# Patient Record
Sex: Male | Born: 1985 | Race: White | Hispanic: No | Marital: Married
Health system: Southern US, Community
[De-identification: ages and names within clinical notes are randomized; demographics above are authoritative.]

## PROBLEM LIST (undated history)

## (undated) DIAGNOSIS — F419 Anxiety disorder, unspecified: Secondary | ICD-10-CM

---

## 2015-03-25 ENCOUNTER — Encounter (HOSPITAL_COMMUNITY): Payer: Self-pay | Admitting: Emergency Medicine

## 2015-03-25 ENCOUNTER — Emergency Department (HOSPITAL_COMMUNITY)
Admission: EM | Admit: 2015-03-25 | Discharge: 2015-03-25 | Disposition: A | Discharge status: Home / Self Care | Payer: Medicaid Other | Attending: Emergency Medicine | Admitting: Emergency Medicine

## 2015-03-25 ENCOUNTER — Emergency Department (HOSPITAL_COMMUNITY): Payer: Medicaid Other

## 2015-03-25 DIAGNOSIS — N200 Calculus of kidney: Secondary | ICD-10-CM | POA: Diagnosis not present

## 2015-03-25 DIAGNOSIS — Z8659 Personal history of other mental and behavioral disorders: Secondary | ICD-10-CM | POA: Diagnosis not present

## 2015-03-25 DIAGNOSIS — R1031 Right lower quadrant pain: Secondary | ICD-10-CM | POA: Diagnosis present

## 2015-03-25 DIAGNOSIS — R109 Unspecified abdominal pain: Secondary | ICD-10-CM

## 2015-03-25 HISTORY — DX: Anxiety disorder, unspecified: F41.9

## 2015-03-25 LAB — COMPREHENSIVE METABOLIC PANEL
ALBUMIN: 4.5 g/dL (ref 3.5–5.0)
ALK PHOS: 84 U/L (ref 38–126)
ALT: 16 U/L — AB (ref 17–63)
ANION GAP: 7 (ref 5–15)
AST: 24 U/L (ref 15–41)
BUN: 5 mg/dL — ABNORMAL LOW (ref 6–20)
CO2: 27 mmol/L (ref 22–32)
Calcium: 9.4 mg/dL (ref 8.9–10.3)
Chloride: 105 mmol/L (ref 101–111)
Creatinine, Ser: 1.19 mg/dL (ref 0.61–1.24)
GFR calc Af Amer: >60 mL/min (ref >60)
GFR calc non Af Amer: >60 mL/min (ref >60)
Glucose, Bld: 102 mg/dL — ABNORMAL HIGH (ref 65–99)
POTASSIUM: 4 mmol/L (ref 3.5–5.1)
SODIUM: 139 mmol/L (ref 135–145)
TOTAL PROTEIN: 7.3 g/dL (ref 6.5–8.1)
Total Bilirubin: 1 mg/dL (ref 0.3–1.2)

## 2015-03-25 LAB — CBC
HCT: 44.1 % (ref 39.0–52.0)
HEMOGLOBIN: 15.2 g/dL (ref 13.0–17.0)
MCH: 32.8 pg (ref 26.0–34.0)
MCHC: 34.5 g/dL (ref 30.0–36.0)
MCV: 95 fL (ref 78.0–100.0)
PLATELETS: 283 10*3/uL (ref 150–400)
RBC: 4.64 MIL/uL (ref 4.22–5.81)
RDW: 12.8 % (ref 11.5–15.5)
WBC: 16.9 10*3/uL — ABNORMAL HIGH (ref 4.0–10.5)

## 2015-03-25 LAB — URINE MICROSCOPIC-ADD ON

## 2015-03-25 LAB — URINALYSIS, ROUTINE W REFLEX MICROSCOPIC
Glucose, UA: NEGATIVE mg/dL
Ketones, ur: NEGATIVE mg/dL
NITRITE: NEGATIVE
Protein, ur: 100 mg/dL — AB
SPECIFIC GRAVITY, URINE: 1.018 (ref 1.005–1.030)
UROBILINOGEN UA: 0.2 mg/dL (ref 0.0–1.0)
pH: 6.5 (ref 5.0–8.0)

## 2015-03-25 LAB — LIPASE, BLOOD: LIPASE: 20 U/L — AB (ref 22–51)

## 2015-03-25 MED ORDER — KETOROLAC TROMETHAMINE 30 MG/ML IJ SOLN
30.0000 mg | Freq: Once | INTRAMUSCULAR | Status: AC
  Administered 2015-03-25: 30 mg via INTRAVENOUS
  Filled 2015-03-25: qty 1

## 2015-03-25 MED ORDER — IBUPROFEN 800 MG PO TABS: 800.0000 mg | ORAL_TABLET | Freq: Three times a day (TID) | ORAL | Status: AC

## 2015-03-25 NOTE — Discharge Instructions (Signed)

## 2015-03-25 NOTE — ED Provider Notes (Signed)
CSN: [id number]     Arrival date & time 03/25/15  1521 History   First MD Initiated Contact with Patient 03/25/15 1548     Chief Complaint  Patient presents with  . Abdominal Pain     (Consider location/radiation/quality/duration/timing/severity/associated sxs/prior Treatment) Patient is a 29 y.o. male presenting with abdominal pain. The history is provided by the patient.  Abdominal Pain Pain location:  RLQ Pain quality: sharp   Pain radiates to:  Back Pain severity:  Moderate Onset quality:  Gradual Timing:  Constant Progression:  Unchanged Chronicity:  New Context: not sick contacts   Associated symptoms: no chills, no cough, no fever and no shortness of breath     Past Medical History  Diagnosis Date  . Anxiety    History reviewed. No pertinent past surgical history. No family history on file. Social History  Substance Use Topics  . Smoking status: None  . Smokeless tobacco: None  . Alcohol Use: None    Review of Systems  Constitutional: Negative for fever and chills.  Respiratory: Negative for cough and shortness of breath.   Gastrointestinal: Positive for abdominal pain.  All other systems reviewed and are negative.     Allergies  Demerol and Morphine and related  Home Medications   Prior to Admission medications   Not on File   BP 118/70 mmHg  Pulse 71  Temp(Src) 97.5 F (36.4 C) (Oral)  Resp 17  SpO2 98% Physical Exam  Constitutional: He is oriented to person, place, and time. He appears well-developed and well-nourished. No distress.  HENT:  Head: Normocephalic and atraumatic.  Mouth/Throat: No oropharyngeal exudate.  Eyes: EOM are normal. Pupils are equal, round, and reactive to light.  Neck: Normal range of motion. Neck supple.  Cardiovascular: Normal rate and regular rhythm.  Exam reveals no friction rub.   No murmur heard. Pulmonary/Chest: Effort normal and breath sounds normal. No respiratory distress. He has no wheezes. He has no  rales.  Abdominal: Soft. He exhibits no distension. There is no tenderness. There is no rebound.  Musculoskeletal: Normal range of motion. He exhibits no edema.  Neurological: He is alert and oriented to person, place, and time.  Skin: He is not diaphoretic.  Nursing note and vitals reviewed.   ED Course  Procedures (including critical care time) Labs Review Labs Reviewed  LIPASE, BLOOD - Abnormal; Notable for the following:    Lipase 20 (*)    All other components within normal limits  COMPREHENSIVE METABOLIC PANEL - Abnormal; Notable for the following:    Glucose, Bld 102 (*)    BUN 5 (*)    ALT 16 (*)    All other components within normal limits  CBC - Abnormal; Notable for the following:    WBC 16.9 (*)    All other components within normal limits  URINALYSIS, ROUTINE W REFLEX MICROSCOPIC (NOT AT [facility]) - Abnormal; Notable for the following:    Color, Urine RED (*)    APPearance TURBID (*)    Hgb urine dipstick LARGE (*)    Bilirubin Urine SMALL (*)    Protein, ur 100 (*)    Leukocytes, UA SMALL (*)    All other components within normal limits  URINE MICROSCOPIC-ADD ON    Imaging Review No results found. Billie Ruddy, personally reviewed and evaluated these images and lab results as part of my medical decision-making.   EKG Interpretation None      MDM   Final diagnoses:  Right flank  pain  Kidney stone    29 year old male here with right lower quadrant pain. Associated hematuria. Radiated to the right flank. No pain at this time. Exam is benign. Denies any genitourinary symptoms. Concern for kidney stone. Toradol given. Will obtain a stone study.  Study negative for acute stone. Likely recently passed stone. Given motrin.   Elwin Mocha, MD 03/25/15 2026

## 2015-03-25 NOTE — ED Notes (Signed)
Per EMS pt complaint of right lower abdominal pain onset an hour ago; associated symptoms n/v; denies GU symptoms. Pt given 250 mcg of fentanyl and 4 mg of zofran en route with EMS.

## 2016-10-05 IMAGING — CT CT RENAL STONE PROTOCOL
2 of 3 series · 16 of 42 positions shown, 18 images · non-contrast
Comparison: None

CLINICAL DATA: Right flank pain starting today

EXAM:
CT ABDOMEN AND PELVIS WITHOUT CONTRAST
TECHNIQUE: Multidetector CT imaging of the abdomen and pelvis was performed
following the standard protocol without IV contrast.

[Series 3: coronal · coronal · 0.74mm/px · 3 of 110 slices shown]
[im 37/110  soft-tissue]
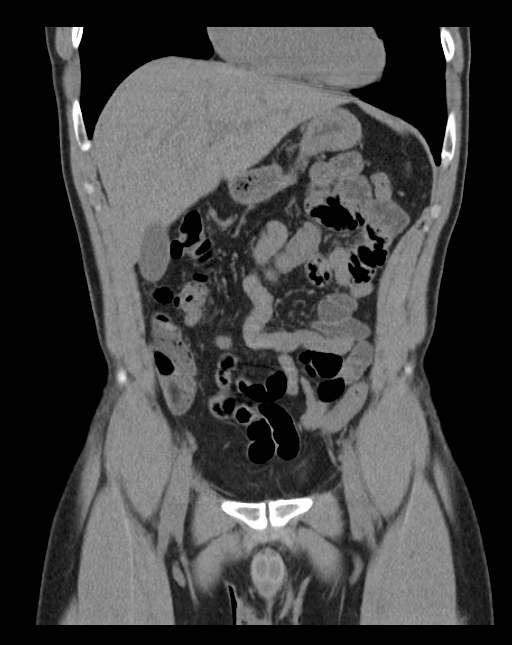
[im 49/110  soft-tissue]
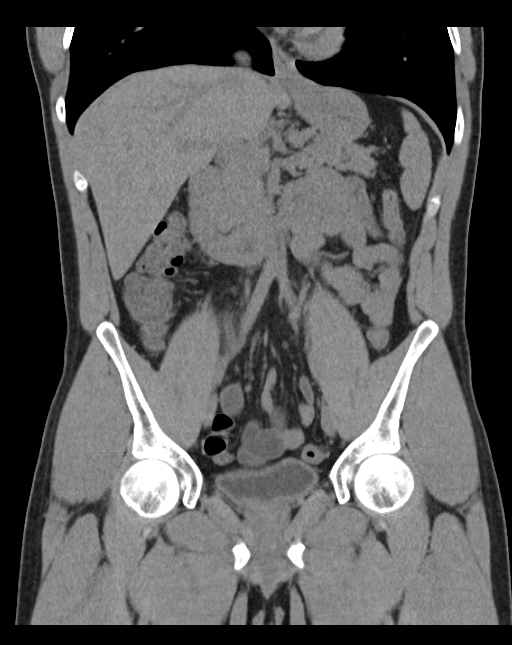
[im 61/110  soft-tissue]
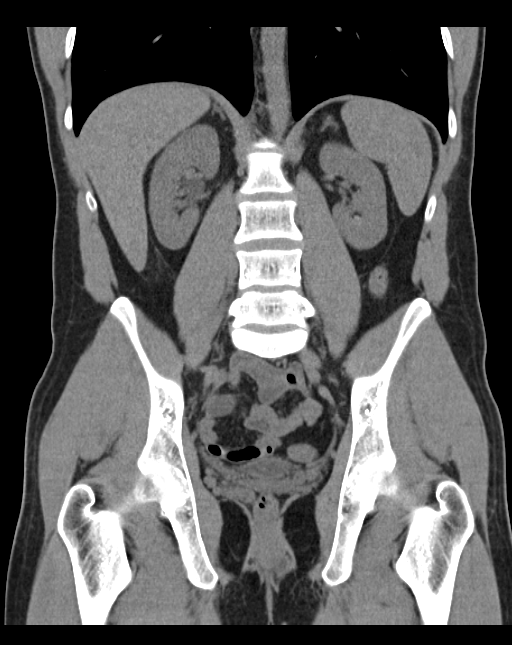

[Series 6: lung · axial · 0.76mm/px · z∈[+1266,+1386]mm · 13 of 28 slices shown, 15 images]
[im 3/28  soft-tissue]
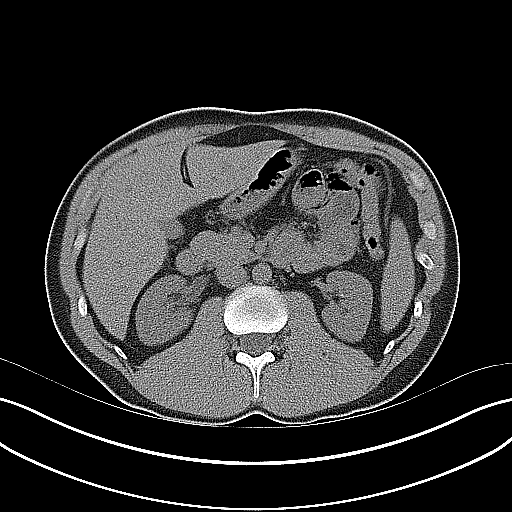
[im 3/28  bone]
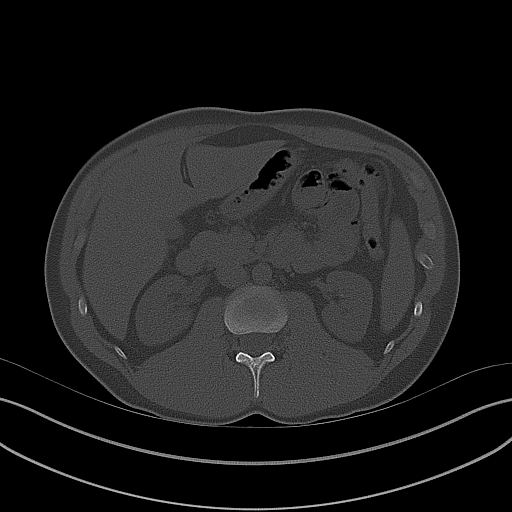
[im 5/28  soft-tissue]
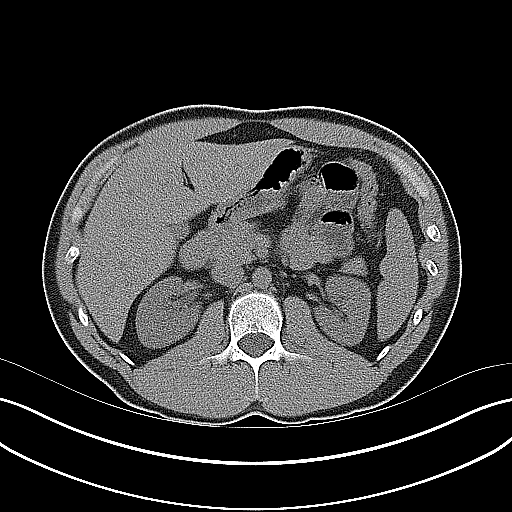
[im 7/28  soft-tissue]
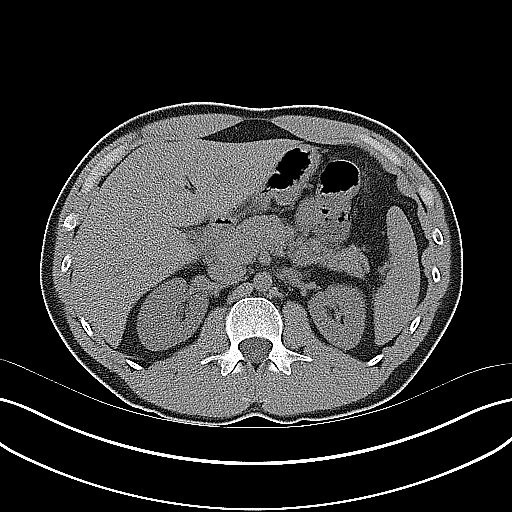
[im 9/28  soft-tissue]
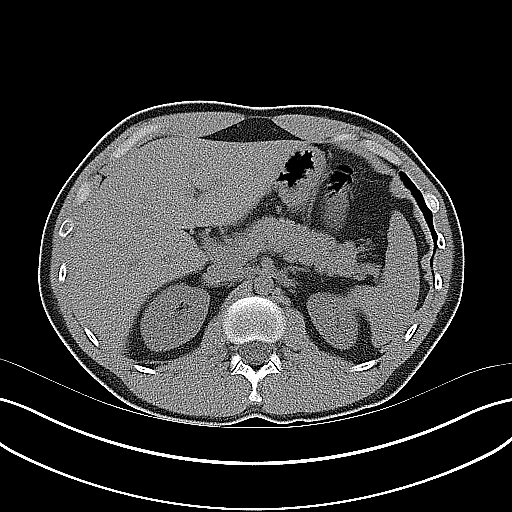
[im 11/28  soft-tissue]
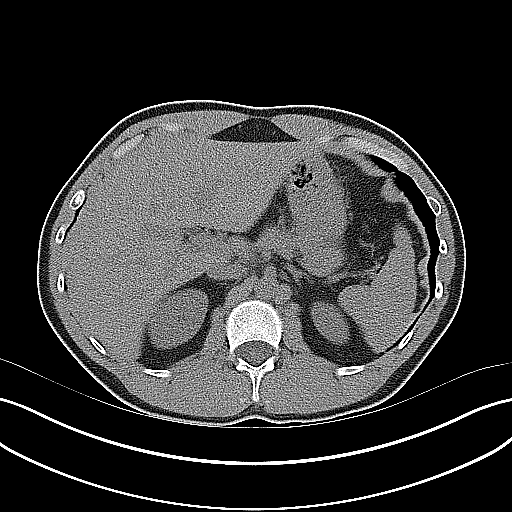
[im 13/28  soft-tissue]
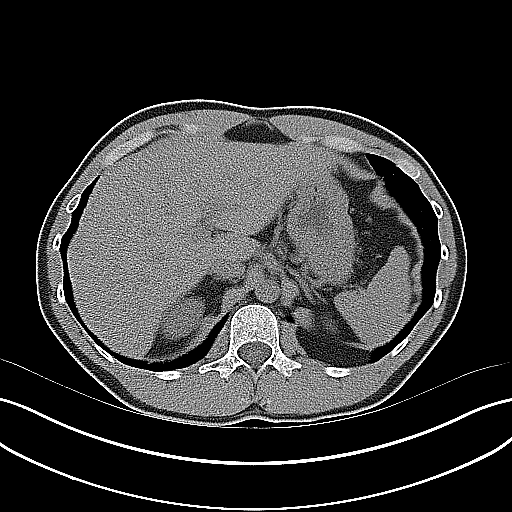
[im 15/28  soft-tissue]
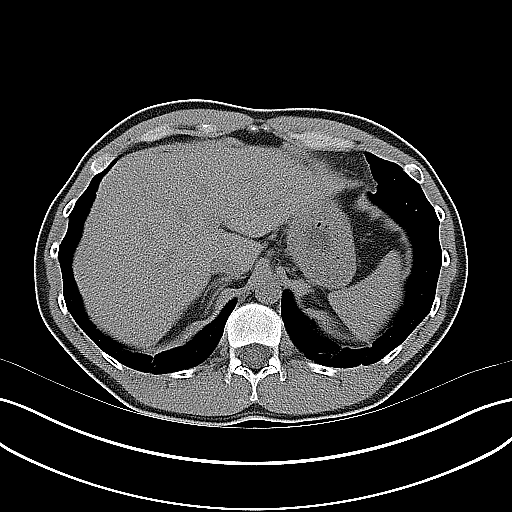
[im 17/28  soft-tissue]
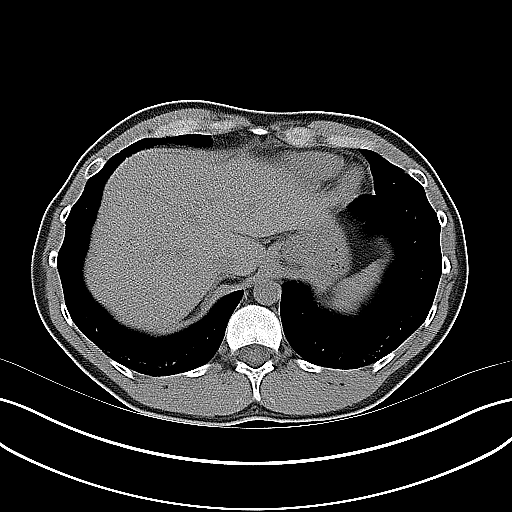
[im 19/28  soft-tissue]
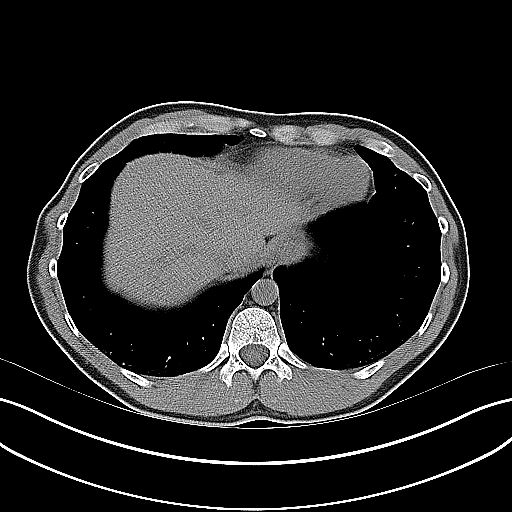
[im 19/28  bone]
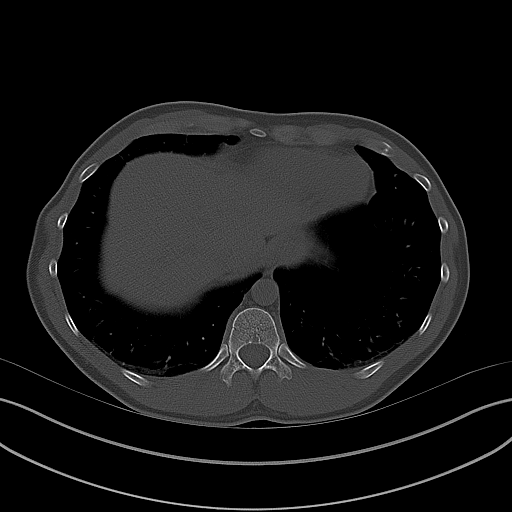
[im 21/28  soft-tissue]
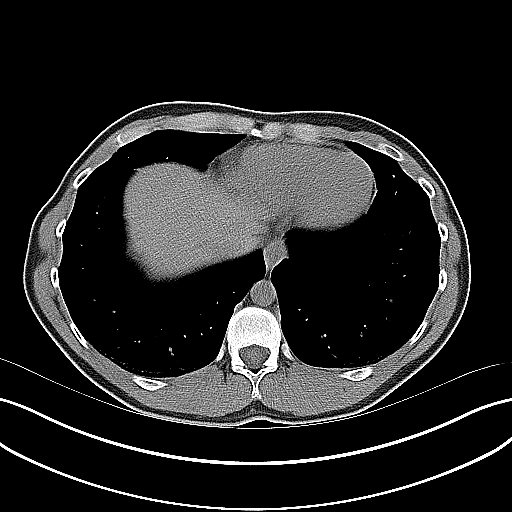
[im 23/28  soft-tissue]
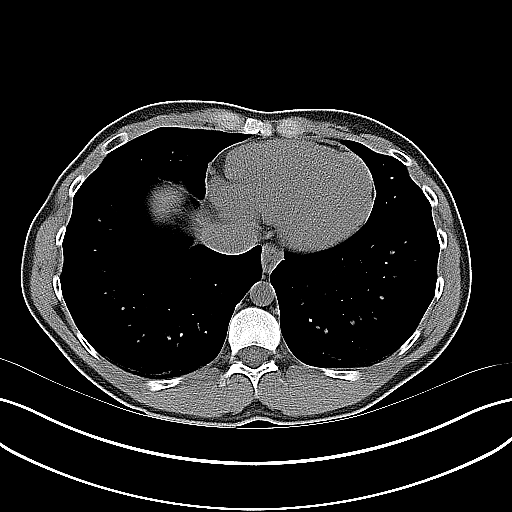
[im 25/28  soft-tissue]
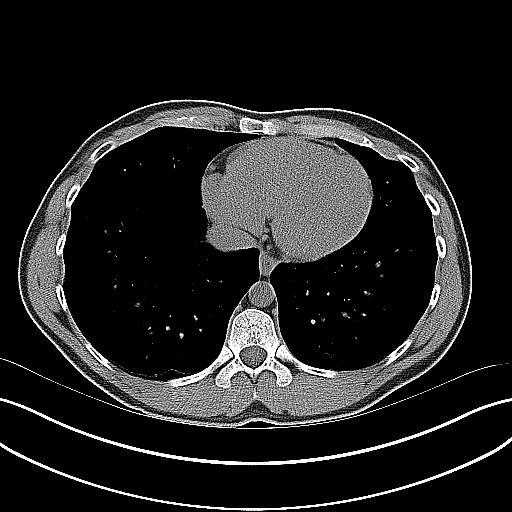
[im 27/28  soft-tissue]
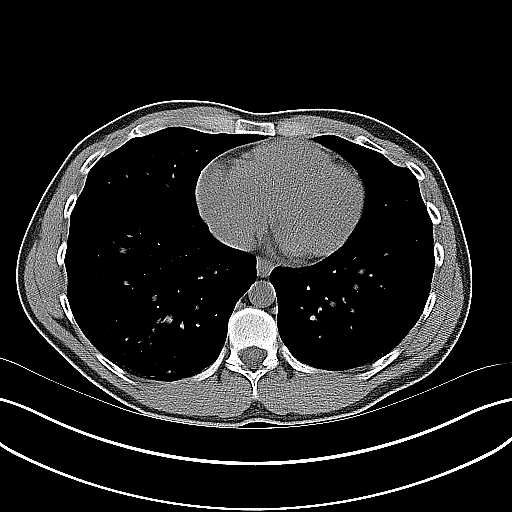

[16 of 42 positions shown; findings below may reference images not displayed]

FINDINGS: Lung bases are unremarkable. Sagittal images of the spine shows
bilateral pars defect at L5 level. Mild about 4 mm anterolisthesis
L5 on S1 vertebral body. Unenhanced liver, pancreas, spleen and
adrenal glands are unremarkable. There is minimal right hydroureter.
No hydronephrosis. At least 2 punctate nonobstructive calcified
calculi are noted within right kidney the largest in midpole
measures 1.5 mm. At least 2 nonobstructive calcified calculi are
noted within left kidney the largest in upper pole measures 1 mm.

No calcified calculi are noted within contracted gallbladder.

There is right periureteral stranding. No calcified ureteral calculi
are noted bilaterally. Findings may be due to a recent passed right
ureteral calculus or right urinary tract inflammation.

In axial image 67 there is a calcified calculus mid posterior aspect
of urinary bladder measures 1.5 mm. Nonspecific mild thickening of
urinary bladder wall. Mild inflammation or cystitis cannot be
excluded.

Abdominal aorta is unremarkable. No small bowel obstruction. No
ascites or free air. No adenopathy.

There is no pericecal inflammation. The terminal ileum is
unremarkable. The appendix is not identified.

Prostate gland and seminal vesicles are unremarkable. No inguinal
adenopathy.
IMPRESSION: 1. There is minimal right hydroureter. No hydronephrosis. Bilateral
nonobstructive nephrolithiasis.
2. No calcified ureteral calculi are noted. Mild right periureteral
stranding. Findings may be due to a recent passed right ureteral
calculus or right urinary tract inflammation/infection.
3. There is a calcified calculus mid posterior aspect of the urinary
bladder measures 1.5 mm. Nonspecific mild thickening of urinary
bladder wall.
4. No pericecal inflammation.  Unremarkable terminal ileum.
5. No small bowel obstruction.
6. Bilateral pars defect at L5 level. Mild anterolisthesis L5 on S1
vertebral body.
